# Patient Record
Sex: Male | Born: 1970 | Race: White | Hispanic: No | Smoking: Current every day smoker
Health system: Southern US, Community
[De-identification: ages and names within clinical notes are randomized; demographics above are authoritative.]

## PROBLEM LIST (undated history)

## (undated) DIAGNOSIS — I10 Essential (primary) hypertension: Secondary | ICD-10-CM

## (undated) HISTORY — PX: APPENDECTOMY: SHX54

---

## 2008-10-26 ENCOUNTER — Emergency Department (HOSPITAL_COMMUNITY): Admission: EM | Admit: 2008-10-26 | Discharge: 2008-10-26 | Payer: Self-pay | Admitting: Emergency Medicine

## 2008-10-29 ENCOUNTER — Emergency Department (HOSPITAL_COMMUNITY): Admission: EM | Admit: 2008-10-29 | Discharge: 2008-10-29 | Payer: Self-pay | Admitting: Emergency Medicine

## 2009-06-24 ENCOUNTER — Emergency Department (HOSPITAL_COMMUNITY): Admission: EM | Admit: 2009-06-24 | Discharge: 2009-06-24 | Payer: Self-pay | Admitting: Emergency Medicine

## 2009-08-13 ENCOUNTER — Emergency Department (HOSPITAL_COMMUNITY): Admission: EM | Admit: 2009-08-13 | Discharge: 2009-08-13 | Payer: Self-pay | Admitting: Emergency Medicine

## 2010-06-13 LAB — URINE MICROSCOPIC-ADD ON

## 2010-06-13 LAB — POCT CARDIAC MARKERS

## 2010-06-13 LAB — POCT I-STAT, CHEM 8
BUN: 7 mg/dL (ref 6–23)
Chloride: 106 mEq/L (ref 96–112)
TCO2: 25 mmol/L (ref 0–100)

## 2010-06-13 LAB — URINALYSIS, ROUTINE W REFLEX MICROSCOPIC
Bilirubin Urine: NEGATIVE
Nitrite: NEGATIVE
Protein, ur: NEGATIVE mg/dL
Urobilinogen, UA: 0.2 mg/dL (ref 0.0–1.0)

## 2010-11-27 ENCOUNTER — Emergency Department (HOSPITAL_COMMUNITY)
Admission: EM | Admit: 2010-11-27 | Discharge: 2010-11-28 | Disposition: A | Discharge status: Home / Self Care | Payer: Self-pay | Attending: Emergency Medicine | Admitting: Emergency Medicine

## 2010-11-27 ENCOUNTER — Emergency Department (HOSPITAL_COMMUNITY): Payer: Self-pay

## 2010-11-27 ENCOUNTER — Encounter: Payer: Self-pay | Admitting: *Deleted

## 2010-11-27 DIAGNOSIS — S91309A Unspecified open wound, unspecified foot, initial encounter: Secondary | ICD-10-CM | POA: Insufficient documentation

## 2010-11-27 DIAGNOSIS — W268XXA Contact with other sharp object(s), not elsewhere classified, initial encounter: Secondary | ICD-10-CM | POA: Insufficient documentation

## 2010-11-27 DIAGNOSIS — Y9289 Other specified places as the place of occurrence of the external cause: Secondary | ICD-10-CM | POA: Insufficient documentation

## 2010-11-27 DIAGNOSIS — S91311A Laceration without foreign body, right foot, initial encounter: Secondary | ICD-10-CM

## 2010-11-27 HISTORY — DX: Essential (primary) hypertension: I10

## 2010-11-27 MED ORDER — OXYCODONE-ACETAMINOPHEN 5-325 MG PO TABS
1.0000 | ORAL_TABLET | Freq: Once | ORAL | Status: AC
  Administered 2010-11-27: 1 via ORAL
  Filled 2010-11-27: qty 1

## 2010-11-27 MED ORDER — LIDOCAINE HCL 2 % IJ SOLN
10.0000 mL | Freq: Once | INTRAMUSCULAR | Status: AC
  Administered 2010-11-27: 200 mg
  Filled 2010-11-27: qty 1

## 2010-11-27 MED ORDER — KETOROLAC TROMETHAMINE 60 MG/2ML IM SOLN
60.0000 mg | Freq: Once | INTRAMUSCULAR | Status: AC
  Administered 2010-11-27: 60 mg via INTRAMUSCULAR
  Filled 2010-11-27: qty 2
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
## 2010-11-28 MED ORDER — CEPHALEXIN 500 MG PO CAPS: 500.0000 mg | ORAL_CAPSULE | Freq: Four times a day (QID) | ORAL | Status: AC

## 2010-11-28 MED ORDER — BACITRACIN ZINC 500 UNIT/GM EX OINT
TOPICAL_OINTMENT | CUTANEOUS | Status: AC
  Filled 2010-11-28: qty 0.9

## 2010-11-28 MED ORDER — CEPHALEXIN 500 MG PO CAPS
500.0000 mg | ORAL_CAPSULE | Freq: Once | ORAL | Status: AC
  Administered 2010-11-28: 500 mg via ORAL
  Filled 2010-11-28: qty 1

## 2010-11-28 MED ORDER — DOUBLE ANTIBIOTIC 500-10000 UNIT/GM EX OINT
TOPICAL_OINTMENT | Freq: Once | CUTANEOUS | Status: AC
  Administered 2010-11-28: 01:00:00 via TOPICAL

## 2010-11-28 MED ORDER — OXYCODONE-ACETAMINOPHEN 5-325 MG PO TABS: 1.0000 | ORAL_TABLET | ORAL | Status: AC | PRN

## 2010-11-28 NOTE — ED Notes (Signed)
Pt left er stating no needs 

## 2010-12-08 NOTE — ED Provider Notes (Signed)
Medical screening examination/treatment/procedure(s) were performed by non-physician practitioner and as supervising physician I was immediately available for consultation/collaboration.   Braylie Badami P Everlina Gotts, MD 12/08/10 1800 

## 2011-10-20 ENCOUNTER — Encounter: Payer: Self-pay | Admitting: Emergency Medicine

## 2016-12-08 ENCOUNTER — Emergency Department (HOSPITAL_COMMUNITY)
Admission: EM | Admit: 2016-12-08 | Discharge: 2016-12-08 | Disposition: A | Discharge status: Home / Self Care | Payer: BLUE CROSS/BLUE SHIELD | Attending: Emergency Medicine | Admitting: Emergency Medicine

## 2016-12-08 ENCOUNTER — Emergency Department (HOSPITAL_COMMUNITY): Payer: BLUE CROSS/BLUE SHIELD

## 2016-12-08 ENCOUNTER — Encounter (HOSPITAL_COMMUNITY): Payer: Self-pay | Admitting: Emergency Medicine

## 2016-12-08 DIAGNOSIS — I10 Essential (primary) hypertension: Secondary | ICD-10-CM | POA: Diagnosis not present

## 2016-12-08 DIAGNOSIS — Y929 Unspecified place or not applicable: Secondary | ICD-10-CM | POA: Insufficient documentation

## 2016-12-08 DIAGNOSIS — S32038A Other fracture of third lumbar vertebra, initial encounter for closed fracture: Secondary | ICD-10-CM | POA: Diagnosis not present

## 2016-12-08 DIAGNOSIS — S300XXA Contusion of lower back and pelvis, initial encounter: Secondary | ICD-10-CM

## 2016-12-08 DIAGNOSIS — S32028A Other fracture of second lumbar vertebra, initial encounter for closed fracture: Secondary | ICD-10-CM | POA: Insufficient documentation

## 2016-12-08 DIAGNOSIS — Y939 Activity, unspecified: Secondary | ICD-10-CM | POA: Insufficient documentation

## 2016-12-08 DIAGNOSIS — S22088A Other fracture of T11-T12 vertebra, initial encounter for closed fracture: Secondary | ICD-10-CM | POA: Diagnosis not present

## 2016-12-08 DIAGNOSIS — F17200 Nicotine dependence, unspecified, uncomplicated: Secondary | ICD-10-CM | POA: Insufficient documentation

## 2016-12-08 DIAGNOSIS — Y999 Unspecified external cause status: Secondary | ICD-10-CM | POA: Insufficient documentation

## 2016-12-08 DIAGNOSIS — S32018A Other fracture of first lumbar vertebra, initial encounter for closed fracture: Secondary | ICD-10-CM | POA: Insufficient documentation

## 2016-12-08 DIAGNOSIS — W11XXXA Fall on and from ladder, initial encounter: Secondary | ICD-10-CM | POA: Diagnosis not present

## 2016-12-08 DIAGNOSIS — S32009A Unspecified fracture of unspecified lumbar vertebra, initial encounter for closed fracture: Secondary | ICD-10-CM

## 2016-12-08 DIAGNOSIS — S3992XA Unspecified injury of lower back, initial encounter: Secondary | ICD-10-CM | POA: Diagnosis present

## 2016-12-08 MED ORDER — IBUPROFEN 600 MG PO TABS: 600.0000 mg | ORAL_TABLET | Freq: Four times a day (QID) | ORAL | 0 refills | Status: AC

## 2016-12-08 MED ORDER — IBUPROFEN 800 MG PO TABS
800.0000 mg | ORAL_TABLET | Freq: Once | ORAL | Status: AC
Start: 2016-12-08 — End: 2016-12-08
  Administered 2016-12-08: 800 mg via ORAL
  Filled 2016-12-08: qty 1

## 2016-12-08 MED ORDER — HYDROCODONE-ACETAMINOPHEN 7.5-325 MG PO TABS: 1.0000 | ORAL_TABLET | ORAL | 0 refills | Status: DC | PRN

## 2016-12-08 MED ORDER — CYCLOBENZAPRINE HCL 10 MG PO TABS: 10.0000 mg | ORAL_TABLET | Freq: Three times a day (TID) | ORAL | 0 refills | Status: AC

## 2016-12-08 NOTE — Discharge Instructions (Signed)
The CT scan of your lumbar spine reveals transverse process fractures at T12, L1, L2, and L3. Please see the staff at the advanced prosthetics office for your brace. Please see Dr. Dutch Quint or member of his team for neurosurgery consultation concerning the transverse process fractures. Please use ibuprofen and Flexeril daily. Use Norco for more severe pain. Flexeril and Norco may cause drowsiness. Please do not take alcohol, drive a vehicle, operating machinery, or participated in activities requiring concentration when taking this medication.

## 2016-12-08 NOTE — ED Triage Notes (Signed)
Pt c/o left lower back [pain after falling from ladder on Wednesday. Pt denies gi/gu/numbness/tingling. nad noted.

## 2016-12-09 NOTE — ED Provider Notes (Signed)
[facility]-EMERGENCY DEPT Provider Note   CSN: [id number] Arrival date & time: 12/08/16  1113     History   Chief Complaint Chief Complaint  Patient presents with  . Back Pain    HPI Allen Johnston. is a 46 y.o. male.  The history is provided by the patient. No language interpreter was used.  Back Pain   This is a new problem. The current episode started 1 to 2 hours ago. The problem occurs constantly. The problem has been gradually worsening. The pain is associated with no known injury. The pain is present in the lumbar spine. The pain does not radiate. The pain is at a severity of 5/10. The pain is moderate. Exacerbated by: fall. The pain is the same all the time. Pertinent negatives include no numbness and no paresis. The treatment provided moderate relief.  Pt fell 10 feet off of a ladder.  Pt complains of pain in his back.    Past Medical History:  Diagnosis Date  . Hypertension     There are no active problems to display for this patient.   Past Surgical History:  Procedure Laterality Date  . APPENDECTOMY         Home Medications    Prior to Admission medications   Medication Sig Start Date End Date Taking? Authorizing Provider  cyclobenzaprine (FLEXERIL) 10 MG tablet Take 1 tablet (10 mg total) by mouth 3 (three) times daily. 12/08/16   Ivery Quale, PA-C  HYDROcodone-acetaminophen (NORCO) 7.5-325 MG tablet Take 1 tablet by mouth every 4 (four) hours as needed. 12/08/16   Ivery Quale, PA-C  ibuprofen (ADVIL,MOTRIN) 600 MG tablet Take 1 tablet (600 mg total) by mouth 4 (four) times daily. 12/08/16   Ivery Quale, PA-C    Family History No family history on file.  Social History Social History  Substance Use Topics  . Smoking status: Current Every Day Smoker    Packs/day: 1.00  . Smokeless tobacco: Never Used  . Alcohol use Yes     Comment: beer     Allergies   Codeine   Review of Systems Review of Systems  Musculoskeletal: Positive for back  pain.  Neurological: Negative for numbness.  All other systems reviewed and are negative.    Physical Exam Updated Vital Signs BP (!) 172/89 (BP Location: Right Arm)   Pulse 92   Temp 98.9 F (37.2 C) (Oral)   Resp 20   Ht  (1.88 m)   Wt 86.2 kg (190 lb)   SpO2 100%   BMI 24.39 kg/m   Physical Exam  Constitutional: He appears well-developed and well-nourished.  HENT:  Head: Normocephalic and atraumatic.  Eyes: Conjunctivae are normal.  Neck: Neck supple.  Cardiovascular: Normal rate and regular rhythm.   No murmur heard. Pulmonary/Chest: Effort normal and breath sounds normal. No respiratory distress.  Abdominal: Soft. There is no tenderness.  Musculoskeletal: He exhibits no edema.  Tender left back flank area,  Pain with movement  Neurological: He is alert.  Skin: Skin is warm and dry.  Psychiatric: He has a normal mood and affect.  Nursing note and vitals reviewed.    ED Treatments / Results  Labs (all labs ordered are listed, but only abnormal results are displayed) Labs Reviewed - No data to display  EKG  EKG Interpretation None       Radiology Dg Lumbar Spine Complete  Result Date: 12/08/2016 CLINICAL DATA:  Low back pain. EXAM: LUMBAR SPINE - COMPLETE 4+ VIEW COMPARISON:  No recent . FINDINGS: Mild L1 compression fracture cannot be excluded. Lumbar spine scoliosis concave right. Diffuse degenerative change. Aortoiliac atherosclerotic vascular calcification. IMPRESSION: 1.  Mild L1 compression fracture cannot be excluded. 2. Lumbar spine scoliosis concave right. Diffuse degenerative change. 3.  Aortoiliac atherosclerotic vascular disease . Electronically Signed   By: Maisie Fus  Register   On: 12/08/2016 13:57   Dg Pelvis 1-2 Views  Result Date: 12/08/2016 CLINICAL DATA:  46 year old male status post fall a distance of about 10 feet 2 days ago. Low back pain radiating to the left side. EXAM: PELVIS - 1-2 VIEW COMPARISON:  Lumbar radiographs today  reported separately. FINDINGS: Femoral heads are normally located. Hip joint spaces appear symmetric and within normal limits. Grossly intact proximal femurs. Pelvis appears intact. Sacral ala and SI joints appear intact. Negative visible bowel gas pattern. IMPRESSION: No acute fracture or dislocation identified about the pelvis. Electronically Signed   By: Odessa Fleming M.D.   On: 12/08/2016 13:57   Ct Lumbar Spine Wo Contrast  Result Date: 12/08/2016 CLINICAL DATA:  Abnormal radiographs question L1 compression fracture EXAM: CT LUMBAR SPINE WITHOUT CONTRAST TECHNIQUE: Multidetector CT imaging of the lumbar spine was performed without intravenous contrast administration. Multiplanar CT image reconstructions were also generated. COMPARISON:  Lumbar spine radiographs 12/08/2016 FINDINGS: Segmentation: 5 non-rib-bearing lumbar vertebra by prior lumbar radiographs, CT labeled accordingly. Alignment: Normal.  Incidentally noted levoconvex lumbar scoliosis. Vertebrae: Osseous demineralization. Multilevel disc space narrowing and endplate spur formation. Multilevel facet degenerative changes. Minimal anterior height loss at L1. Prominent inferior endplate spur. No definite vertebral body fracture is identified. However, transverse process fractures are identified at LEFT T12, LEFT L1, LEFT L2, and LEFT L3. The fractures at L1 and L2 are mildly displaced. An old appearing LEFT superior endplate fracture is identified at L4. Significant spinal stenosis at L4-L5, multifactorial due to bulging disc, facet hypertrophy and ligamentum flavum thickening. Neural foraminal stenosis at LEFT L4-L5 and L5-S1 due to dominantly facet hypertrophy. Paraspinal and other soft tissues: Atherosclerotic calcifications aorta. No significant paraspinal abnormalities. Disc levels: Multilevel disc space narrowing, endplate spur formation and bulging discs. IMPRESSION: Old LEFT superior endplate compression deformity of L4 vertebral body. Mild  anterior height loss of L1 vertebral body appears old without definite acute fracture plane identified. LEFT transverse process fractures of T12, L1, L2, and L3. Multifactorial spinal stenosis L4-L5. Multilevel degenerative disc and facet disease changes of the lumbar spine with levoconvex scoliosis. Aortic Atherosclerosis (ICD10-I70.0). Electronically Signed   By: Ulyses Southward M.D.   On: 12/08/2016 17:57    Procedures Procedures (including critical care time)  Medications Ordered in ED Medications  ibuprofen (ADVIL,MOTRIN) tablet 800 mg (800 mg Oral Given 12/08/16 1358)     Initial Impression / Assessment and Plan / ED Course  I have reviewed the triage vital signs and the nursing notes.  Pertinent labs & imaging results that were available during my care of the patient were reviewed by me and considered in my medical decision making (see chart for details).     Pt's care turned over to Ivery Quale [facility]   Final Clinical Impressions(s) / ED Diagnoses   Final diagnoses:  Lumbar transverse process fracture, closed, initial encounter [facility])  Lumbar contusion, initial encounter    New Prescriptions Discharge Medication List as of 12/08/2016  6:11 PM    START taking these medications   Details  cyclobenzaprine (FLEXERIL) 10 MG tablet Take 1 tablet (10 mg total) by mouth 3 (three) times daily., Starting Fri  12/08/2016, Print    HYDROcodone-acetaminophen (NORCO) 7.5-325 MG tablet Take 1 tablet by mouth every 4 (four) hours as needed., Starting Fri 12/08/2016, Print         Elson Areas, [place] 12/09/16 9562    Loren Racer, MD 12/11/16 1526

## 2019-02-21 ENCOUNTER — Encounter (HOSPITAL_COMMUNITY): Payer: Self-pay

## 2019-02-21 ENCOUNTER — Emergency Department (HOSPITAL_COMMUNITY): Payer: BC Managed Care – PPO

## 2019-02-21 ENCOUNTER — Other Ambulatory Visit: Payer: Self-pay

## 2019-02-21 ENCOUNTER — Emergency Department (HOSPITAL_COMMUNITY)
Admission: EM | Admit: 2019-02-21 | Discharge: 2019-02-22 | Disposition: A | Discharge status: Home / Self Care | Payer: BC Managed Care – PPO | Attending: Emergency Medicine | Admitting: Emergency Medicine

## 2019-02-21 DIAGNOSIS — I10 Essential (primary) hypertension: Secondary | ICD-10-CM | POA: Insufficient documentation

## 2019-02-21 DIAGNOSIS — S42021A Displaced fracture of shaft of right clavicle, initial encounter for closed fracture: Secondary | ICD-10-CM | POA: Diagnosis not present

## 2019-02-21 DIAGNOSIS — S4991XA Unspecified injury of right shoulder and upper arm, initial encounter: Secondary | ICD-10-CM | POA: Diagnosis present

## 2019-02-21 DIAGNOSIS — F17210 Nicotine dependence, cigarettes, uncomplicated: Secondary | ICD-10-CM | POA: Diagnosis not present

## 2019-02-21 DIAGNOSIS — Z79899 Other long term (current) drug therapy: Secondary | ICD-10-CM | POA: Insufficient documentation

## 2019-02-21 DIAGNOSIS — Y92008 Other place in unspecified non-institutional (private) residence as the place of occurrence of the external cause: Secondary | ICD-10-CM | POA: Diagnosis not present

## 2019-02-21 DIAGNOSIS — Y999 Unspecified external cause status: Secondary | ICD-10-CM | POA: Diagnosis not present

## 2019-02-21 DIAGNOSIS — Y93G2 Activity, grilling and smoking food: Secondary | ICD-10-CM | POA: Diagnosis not present

## 2019-02-21 DIAGNOSIS — W1789XA Other fall from one level to another, initial encounter: Secondary | ICD-10-CM | POA: Diagnosis not present

## 2019-02-21 MED ORDER — HYDROCODONE-ACETAMINOPHEN 5-325 MG PO TABS
1.0000 | ORAL_TABLET | Freq: Once | ORAL | Status: AC
Start: 2019-02-22 — End: 2019-02-22
  Administered 2019-02-22: 1 via ORAL
  Filled 2019-02-21: qty 1

## 2019-02-21 MED ORDER — HYDROCODONE-ACETAMINOPHEN 5-325 MG PO TABS: 1.0000 | ORAL_TABLET | ORAL | 0 refills | Status: AC | PRN

## 2019-02-21 NOTE — Discharge Instructions (Signed)
Wear your sling at all times to protect your injury.  Ice your clavicle as much as is comfortable for the first several days to help with pain and swelling.  It is important for you to maintain range of motion in your elbow, make sure you flex your forearm for several repetitions throughout the day.  You may take the hydrocodone prescribed for pain relief.  This will make you drowsy - do not drive within 4 hours of taking this medication.

## 2019-02-21 NOTE — ED Triage Notes (Signed)
Pt was cooking when he tipped and fell off his low lying deck.and injured his right collar. Said there is deformity.

## 2019-02-22 NOTE — ED Provider Notes (Signed)
[facility] EMERGENCY DEPARTMENT Provider Note   CSN: [id number] Arrival date & time: 02/21/19  2218     History   Chief Complaint Chief Complaint  Patient presents with  . Collar Bone Injury    HPI Selwyn Reason. is a 48 y.o. male, right handed, presenting with suspected right clavicle fracture.  He was on his deck prior to arrival grilling a meal when he stepped backward and fell over the edge, landing onto grass predominantly with the right shoulder. He has constant pain mid clavicle with palpable deformity. He denies weakness or numbness in the upper extremities.  Denies other injury including head or neck pain.  He has had no tx prior to arrival.  Reports left clavicle fracture many years ago.       HPI  Past Medical History:  Diagnosis Date  . Hypertension     There are no active problems to display for this patient.   Past Surgical History:  Procedure Laterality Date  . APPENDECTOMY          Home Medications    Prior to Admission medications   Medication Sig Start Date End Date Taking? Authorizing Provider  cyclobenzaprine (FLEXERIL) 10 MG tablet Take 1 tablet (10 mg total) by mouth 3 (three) times daily. 12/08/16   Lily Kocher, PA-C  HYDROcodone-acetaminophen (NORCO/VICODIN) 5-325 MG tablet Take 1 tablet by mouth every 4 (four) hours as needed. 02/21/19   Evalee Jefferson, PA-C  ibuprofen (ADVIL,MOTRIN) 600 MG tablet Take 1 tablet (600 mg total) by mouth 4 (four) times daily. 12/08/16   Lily Kocher, PA-C    Family History History reviewed. No pertinent family history.  Social History Social History   Tobacco Use  . Smoking status: Current Every Day Smoker    Packs/day: 1.00  . Smokeless tobacco: Never Used  Substance Use Topics  . Alcohol use: Yes    Comment: beer  . Drug use: No     Allergies   Codeine   Review of Systems Review of Systems  Constitutional: Negative.  Negative for fever.  HENT: Negative.   Respiratory: Negative.    Cardiovascular: Negative.   Gastrointestinal: Negative.   Musculoskeletal: Positive for arthralgias and joint swelling. Negative for back pain, myalgias and neck pain.  Neurological: Negative for weakness, numbness and headaches.     Physical Exam Updated Vital Signs BP (!) 152/100   Pulse 94   Temp 99 F (37.2 C)   Resp 17   Ht 6\' 2"  (1.88 m)   Wt 83.9 kg   SpO2 100%   BMI 23.75 kg/m   Physical Exam Constitutional:      Appearance: He is well-developed.  HENT:     Head: Atraumatic.  Neck:     Musculoskeletal: Normal range of motion.  Cardiovascular:     Comments: Pulses equal bilaterally Musculoskeletal:        General: Swelling and tenderness present.     Right shoulder: He exhibits bony tenderness and swelling.     Comments: ttp with palpable deformity mid right clavicle. Skin intact.  No deltoid pain or numbness.  Equal grip strength.  FROM of wrists and elbows without deficit.    Skin:    General: Skin is warm and dry.  Neurological:     Mental Status: He is alert.     Sensory: No sensory deficit.     Deep Tendon Reflexes: Reflexes normal.      ED Treatments / Results  Labs (all labs ordered are  listed, but only abnormal results are displayed) Labs Reviewed - No data to display  EKG None  Radiology Dg Clavicle Right  Result Date: 02/21/2019 CLINICAL DATA:  Pain EXAM: RIGHT CLAVICLE - 2+ VIEWS COMPARISON:  None. FINDINGS: Acute displaced fracture of the mid right clavicle with surrounding soft tissue swelling. There is no evidence for a glenohumeral dislocation. There are mild degenerative changes of the right glenohumeral joint and right AC joint. IMPRESSION: Acute displaced fracture of the mid right clavicle with surrounding soft tissue swelling. No evidence for glenohumeral dislocation. Electronically Signed   By: Katherine Mantle M.D.   On: 02/21/2019 23:28    Procedures Procedures (including critical care time)  Medications Ordered in ED  Medications  HYDROcodone-acetaminophen (NORCO/VICODIN) 5-325 MG per tablet 1 tablet (1 tablet Oral Given 02/22/19 0015)     Initial Impression / Assessment and Plan / ED Course  I have reviewed the triage vital signs and the nursing notes.  Pertinent labs & imaging results that were available during my care of the patient were reviewed by me and considered in my medical decision making (see chart for details).        Imaging reviewed and discussed with pt. Sling provided. Ice packs, ROM of elbow.  Hydrocodone.  Referral to ortho for f/u care.  Work note given. 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  Work note given.   Final Clinical Impressions(s) / ED Diagnoses   Final diagnoses:  Closed displaced fracture of shaft of right clavicle, initial encounter    ED Discharge Orders         Ordered    HYDROcodone-acetaminophen (NORCO/VICODIN) 5-325 MG tablet  Every 4 hours PRN     02/21/19 2353           Burgess Amor, PA-C 02/22/19 0110    Geoffery Lyons, MD 02/22/19 [phone]

## 2019-02-22 NOTE — ED Notes (Signed)
Pt provided crackers with pain medicine.

## 2019-03-06 ENCOUNTER — Other Ambulatory Visit: Payer: Self-pay

## 2019-03-06 NOTE — Progress Notes (Deleted)
   New Patient Office Visit  Assessment & Plan:  ***  Follow-up: No follow-ups on file.   Allen Limes, MSN, APRN, FNP-C [facility]  Subjective:  Patient ID: Allen Johnston., male    DOB: 02/16/1971  Age: 48 y.o. MRN: [id number]  Patient Care Team: Patient, No Pcp Per as PCP - General (General Practice)  CC: No chief complaint on file.   HPI Allen Johnston. presents to establish care. *** Patient also ***   ROS  Current Outpatient Medications:  .  cyclobenzaprine (FLEXERIL) 10 MG tablet, Take 1 tablet (10 mg total) by mouth 3 (three) times daily., Disp: 20 tablet, Rfl: 0 .  HYDROcodone-acetaminophen (NORCO/VICODIN) 5-325 MG tablet, Take 1 tablet by mouth every 4 (four) hours as needed., Disp: 20 tablet, Rfl: 0 .  ibuprofen (ADVIL,MOTRIN) 600 MG tablet, Take 1 tablet (600 mg total) by mouth 4 (four) times daily., Disp: 30 tablet, Rfl: 0  Allergies  Allergen Reactions  . Codeine Nausea And Vomiting    Past Medical History:  Diagnosis Date  . Hypertension     Past Surgical History:  Procedure Laterality Date  . APPENDECTOMY      No family history on file.  Social History   Socioeconomic History  . Marital status: Divorced    Spouse name: Not on file  . Number of children: Not on file  . Years of education: Not on file  . Highest education level: Not on file  Occupational History  . Not on file  Tobacco Use  . Smoking status: Current Every Day Smoker    Packs/day: 1.00  . Smokeless tobacco: Never Used  Substance and Sexual Activity  . Alcohol use: Yes    Comment: beer  . Drug use: No  . Sexual activity: Not Currently  Other Topics Concern  . Not on file  Social History Narrative  . Not on file   Social Determinants of Health   Financial Resource Strain:   . Difficulty of Paying Living Expenses: Not on file  Food Insecurity:   . Worried About Charity fundraiser in the Last Year: Not on file  . Ran Out of Food in the  Last Year: Not on file  Transportation Needs:   . Lack of Transportation (Medical): Not on file  . Lack of Transportation (Non-Medical): Not on file  Physical Activity:   . Days of Exercise per Week: Not on file  . Minutes of Exercise per Session: Not on file  Stress:   . Feeling of Stress : Not on file  Social Connections:   . Frequency of Communication with Friends and Family: Not on file  . Frequency of Social Gatherings with Friends and Family: Not on file  . Attends Religious Services: Not on file  . Active Member of Clubs or Organizations: Not on file  . Attends Archivist Meetings: Not on file  . Marital Status: Not on file  Intimate Partner Violence:   . Fear of Current or Ex-Partner: Not on file  . Emotionally Abused: Not on file  . Physically Abused: Not on file  . Sexually Abused: Not on file    Objective:   Today's Vitals: There were no vitals taken for this visit.  Physical Exam

## 2019-03-07 ENCOUNTER — Ambulatory Visit: Payer: BC Managed Care – PPO | Admitting: Family Medicine

## 2019-03-12 ENCOUNTER — Ambulatory Visit: Payer: BC Managed Care – PPO | Admitting: Family Medicine

## 2019-03-13 ENCOUNTER — Ambulatory Visit: Payer: Self-pay | Admitting: Family Medicine

## 2020-05-05 IMAGING — DX DG CLAVICLE*R*
2 series · 2 of 2 positions shown · non-contrast
Comparison: None.

CLINICAL DATA: Pain

EXAM:
RIGHT CLAVICLE - 2+ VIEWS

[clavicle ap]
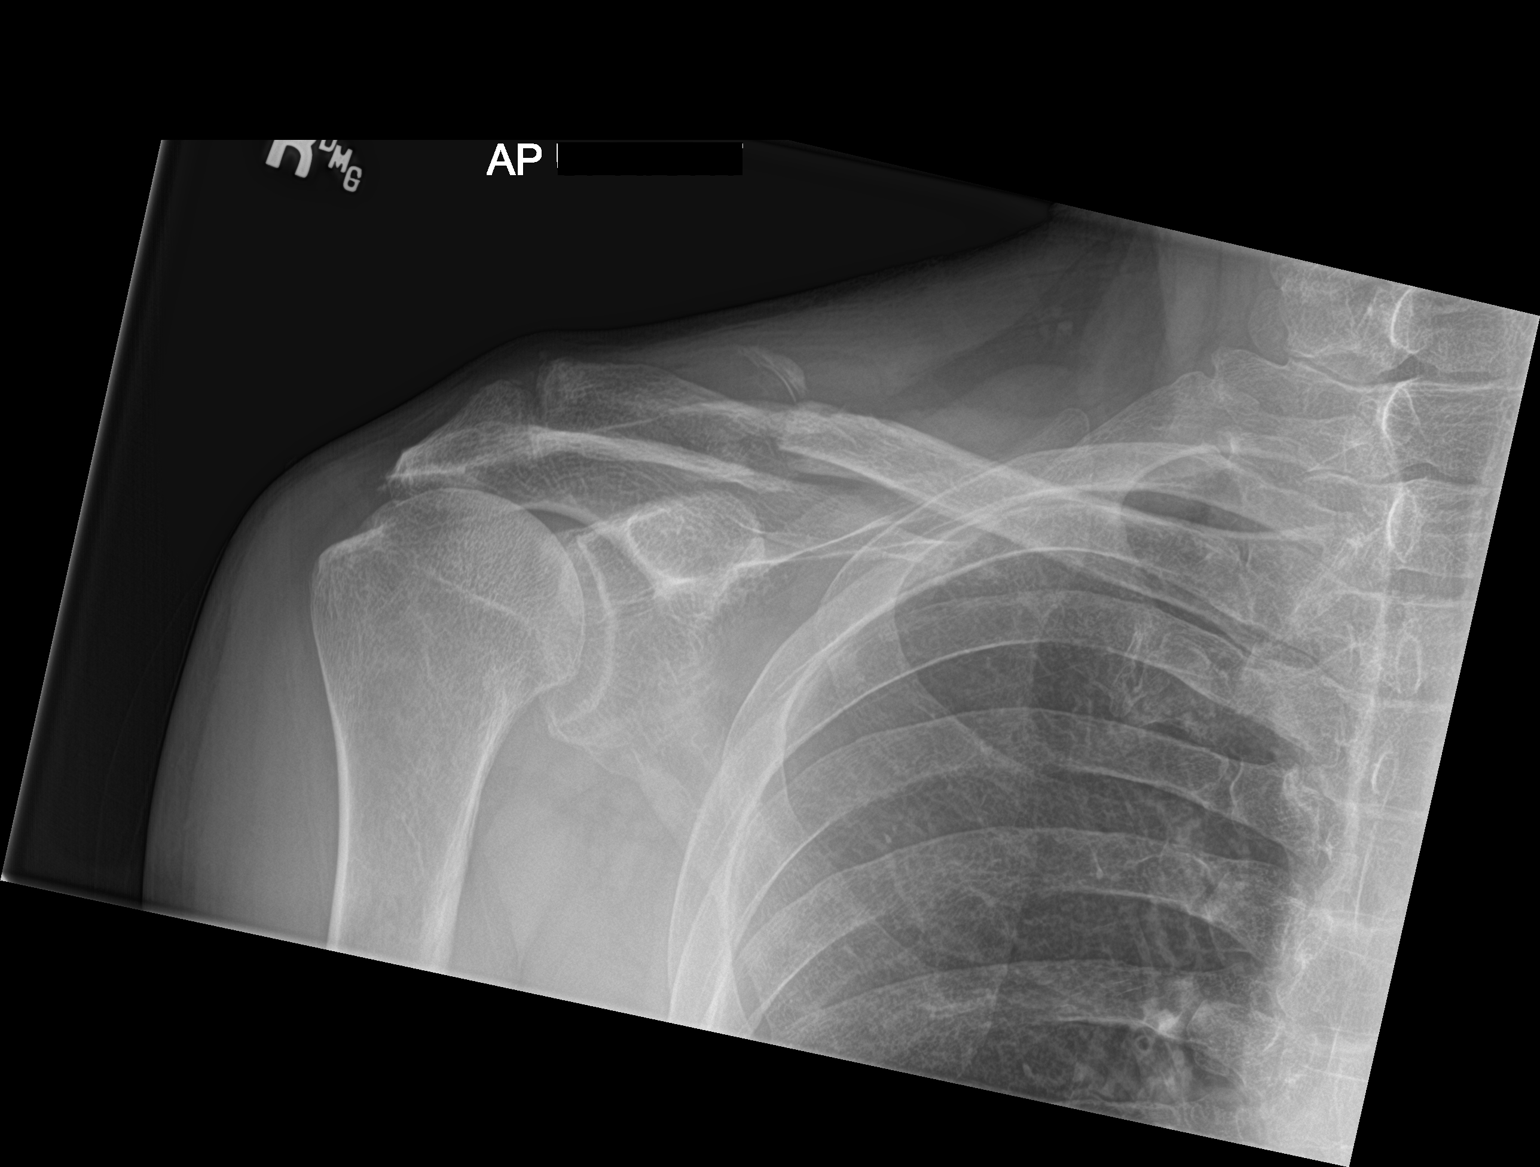

[clavicle axial]
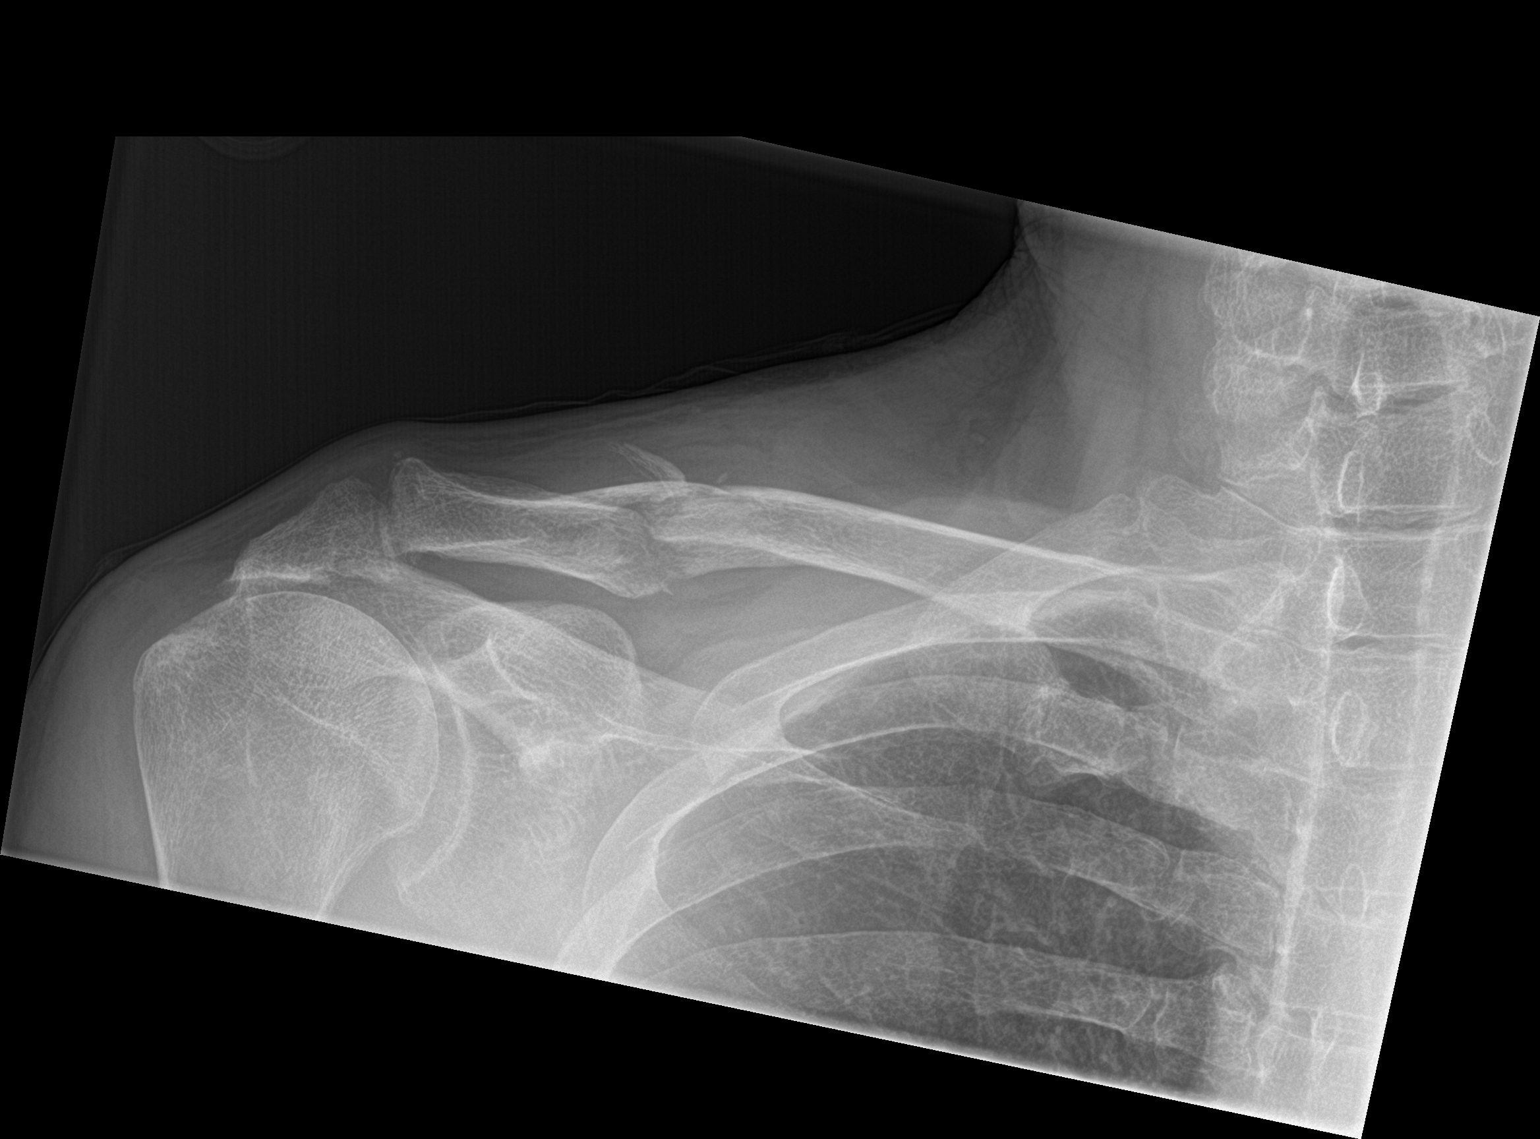

[2 of 2 positions shown; findings below may reference images not displayed]

FINDINGS: Acute displaced fracture of the mid right clavicle with surrounding
soft tissue swelling. There is no evidence for a glenohumeral
dislocation. There are mild degenerative changes of the right
glenohumeral joint and right AC joint.
IMPRESSION: Acute displaced fracture of the mid right clavicle with surrounding
soft tissue swelling. No evidence for glenohumeral dislocation.
